# Patient Record
Sex: Female | Born: 1970 | Race: White | Hispanic: No | Smoking: Never smoker
Health system: Southern US, Community
[De-identification: ages and names within clinical notes are randomized; demographics above are authoritative.]

## PROBLEM LIST (undated history)

## (undated) HISTORY — PX: CATARACT EXTRACTION: SUR2

## (undated) HISTORY — PX: TUBAL LIGATION: SHX77

---

## 2005-11-29 ENCOUNTER — Other Ambulatory Visit: Payer: Self-pay

## 2005-11-29 ENCOUNTER — Emergency Department: Payer: Self-pay | Admitting: Emergency Medicine

## 2006-03-09 ENCOUNTER — Emergency Department: Payer: Self-pay | Admitting: Emergency Medicine

## 2006-11-07 ENCOUNTER — Encounter: Payer: Self-pay | Admitting: Maternal & Fetal Medicine

## 2006-11-18 ENCOUNTER — Encounter: Payer: Self-pay | Admitting: Maternal & Fetal Medicine

## 2006-12-19 ENCOUNTER — Emergency Department: Payer: Self-pay | Admitting: Emergency Medicine

## 2007-03-29 ENCOUNTER — Observation Stay: Payer: Self-pay

## 2007-04-03 ENCOUNTER — Observation Stay: Payer: Self-pay | Admitting: Obstetrics and Gynecology

## 2007-04-26 ENCOUNTER — Inpatient Hospital Stay: Payer: Self-pay | Admitting: Certified Nurse Midwife

## 2008-07-02 ENCOUNTER — Emergency Department: Payer: Self-pay | Admitting: Emergency Medicine

## 2008-12-31 ENCOUNTER — Ambulatory Visit: Payer: Self-pay | Admitting: Obstetrics and Gynecology

## 2010-12-04 ENCOUNTER — Emergency Department: Payer: Self-pay | Admitting: Emergency Medicine

## 2011-05-12 ENCOUNTER — Ambulatory Visit: Payer: Self-pay | Admitting: Neurology

## 2011-10-15 ENCOUNTER — Emergency Department: Payer: Self-pay | Admitting: *Deleted

## 2011-10-15 LAB — COMPREHENSIVE METABOLIC PANEL
Alkaline Phosphatase: 75 U/L (ref 50–136)
Calcium, Total: 9.2 mg/dL (ref 8.5–10.1)
Chloride: 104 mmol/L (ref 98–107)
Co2: 28 mmol/L (ref 21–32)
EGFR (African American): >60
EGFR (Non-African Amer.): >60
SGOT(AST): 33 U/L (ref 15–37)
SGPT (ALT): 30 U/L

## 2011-10-15 LAB — CBC
Platelet: 178 10*3/uL (ref 150–440)
RDW: 12.9 % (ref 11.5–14.5)
WBC: 7.1 10*3/uL (ref 3.6–11.0)

## 2011-10-15 LAB — URINALYSIS, COMPLETE
Bacteria: NONE SEEN
Bilirubin,UR: NEGATIVE
Blood: NEGATIVE
Glucose,UR: NEGATIVE mg/dL (ref 0–75)
Ketone: NEGATIVE
Nitrite: NEGATIVE
Specific Gravity: 1.016 (ref 1.003–1.030)
WBC UR: 1 /HPF (ref 0–5)

## 2011-10-15 LAB — PREGNANCY, URINE: Pregnancy Test, Urine: NEGATIVE m[IU]/mL

## 2011-10-18 ENCOUNTER — Emergency Department: Payer: Self-pay | Admitting: Emergency Medicine

## 2011-10-18 LAB — COMPREHENSIVE METABOLIC PANEL
Alkaline Phosphatase: 58 U/L (ref 50–136)
Bilirubin,Total: 0.3 mg/dL (ref 0.2–1.0)
Chloride: 99 mmol/L (ref 98–107)
Co2: 29 mmol/L (ref 21–32)
Creatinine: 1.14 mg/dL (ref 0.60–1.30)
EGFR (African American): >60
EGFR (Non-African Amer.): 57 — ABNORMAL LOW
Potassium: 3.5 mmol/L (ref 3.5–5.1)
SGOT(AST): 29 U/L (ref 15–37)
SGPT (ALT): 36 U/L

## 2011-10-19 LAB — URINALYSIS, COMPLETE
Nitrite: NEGATIVE
Protein: 30
RBC,UR: 3 /HPF (ref 0–5)
WBC UR: 8 /HPF (ref 0–5)

## 2012-05-13 ENCOUNTER — Emergency Department: Payer: Self-pay | Admitting: Emergency Medicine

## 2012-05-23 ENCOUNTER — Emergency Department: Payer: Self-pay | Admitting: Emergency Medicine

## 2012-05-23 ENCOUNTER — Emergency Department: Payer: Self-pay | Admitting: Unknown Physician Specialty

## 2012-05-23 LAB — BASIC METABOLIC PANEL
Anion Gap: 8 (ref 7–16)
BUN: 12 mg/dL (ref 7–18)
Co2: 28 mmol/L (ref 21–32)
Creatinine: 0.71 mg/dL (ref 0.60–1.30)
EGFR (African American): >60
EGFR (Non-African Amer.): >60
Glucose: 83 mg/dL (ref 65–99)
Sodium: 140 mmol/L (ref 136–145)

## 2012-05-23 LAB — CBC
MCH: 30.5 pg (ref 26.0–34.0)
Platelet: 221 10*3/uL (ref 150–440)
RDW: 13.7 % (ref 11.5–14.5)
WBC: 7.2 10*3/uL (ref 3.6–11.0)

## 2012-08-14 ENCOUNTER — Emergency Department: Payer: Self-pay | Admitting: Emergency Medicine

## 2013-06-03 ENCOUNTER — Ambulatory Visit: Payer: Self-pay

## 2015-05-09 ENCOUNTER — Other Ambulatory Visit: Payer: Self-pay | Admitting: Obstetrics and Gynecology

## 2015-05-09 DIAGNOSIS — Z1231 Encounter for screening mammogram for malignant neoplasm of breast: Secondary | ICD-10-CM

## 2015-05-13 ENCOUNTER — Ambulatory Visit
Admission: RE | Admit: 2015-05-13 | Discharge: 2015-05-13 | Disposition: A | Discharge status: Home / Self Care | Payer: BLUE CROSS/BLUE SHIELD | Source: Ambulatory Visit | Attending: Obstetrics and Gynecology | Admitting: Obstetrics and Gynecology

## 2015-05-13 DIAGNOSIS — Z1231 Encounter for screening mammogram for malignant neoplasm of breast: Secondary | ICD-10-CM | POA: Diagnosis not present

## 2015-10-18 ENCOUNTER — Encounter: Payer: Self-pay | Admitting: Emergency Medicine

## 2015-10-18 ENCOUNTER — Emergency Department
Admission: EM | Admit: 2015-10-18 | Discharge: 2015-10-18 | Disposition: A | Discharge status: Home / Self Care | Payer: BLUE CROSS/BLUE SHIELD | Attending: Emergency Medicine | Admitting: Emergency Medicine

## 2015-10-18 DIAGNOSIS — K2970 Gastritis, unspecified, without bleeding: Secondary | ICD-10-CM

## 2015-10-18 DIAGNOSIS — R079 Chest pain, unspecified: Secondary | ICD-10-CM | POA: Diagnosis present

## 2015-10-18 DIAGNOSIS — R1013 Epigastric pain: Secondary | ICD-10-CM

## 2015-10-18 LAB — COMPREHENSIVE METABOLIC PANEL
ALBUMIN: 4.3 g/dL (ref 3.5–5.0)
ALT: 13 U/L — AB (ref 14–54)
AST: 21 U/L (ref 15–41)
Alkaline Phosphatase: 69 U/L (ref 38–126)
Anion gap: 6 (ref 5–15)
BUN: 5 mg/dL — AB (ref 6–20)
CO2: 29 mmol/L (ref 22–32)
Calcium: 10.8 mg/dL — ABNORMAL HIGH (ref 8.9–10.3)
Chloride: 101 mmol/L (ref 101–111)
Creatinine, Ser: 0.84 mg/dL (ref 0.44–1.00)
GFR calc Af Amer: >60 mL/min (ref >60)
GFR calc non Af Amer: >60 mL/min (ref >60)
GLUCOSE: 91 mg/dL (ref 65–99)
POTASSIUM: 3.7 mmol/L (ref 3.5–5.1)
Sodium: 136 mmol/L (ref 135–145)
Total Bilirubin: 0.4 mg/dL (ref 0.3–1.2)
Total Protein: 7.3 g/dL (ref 6.5–8.1)

## 2015-10-18 LAB — CBC WITH DIFFERENTIAL/PLATELET
BASOS ABS: 0 10*3/uL (ref 0–0.1)
BASOS PCT: 1 %
Eosinophils Absolute: 0.1 10*3/uL (ref 0–0.7)
Eosinophils Relative: 1 %
HEMATOCRIT: 33.4 % — AB (ref 35.0–47.0)
Hemoglobin: 11.1 g/dL — ABNORMAL LOW (ref 12.0–16.0)
Lymphocytes Relative: 18 %
Lymphs Abs: 1 10*3/uL (ref 1.0–3.6)
MCH: 27.5 pg (ref 26.0–34.0)
MCHC: 33.2 g/dL (ref 32.0–36.0)
MCV: 83.1 fL (ref 80.0–100.0)
MONO ABS: 0.3 10*3/uL (ref 0.2–0.9)
Monocytes Relative: 5 %
NEUTROS ABS: 4.2 10*3/uL (ref 1.4–6.5)
Neutrophils Relative %: 75 %
PLATELETS: 253 10*3/uL (ref 150–440)
RBC: 4.02 MIL/uL (ref 3.80–5.20)
RDW: 17.2 % — AB (ref 11.5–14.5)
WBC: 5.5 10*3/uL (ref 3.6–11.0)

## 2015-10-18 LAB — URINALYSIS COMPLETE WITH MICROSCOPIC (ARMC ONLY)
Bilirubin Urine: NEGATIVE
Glucose, UA: NEGATIVE mg/dL
Ketones, ur: NEGATIVE mg/dL
Nitrite: NEGATIVE
PROTEIN: NEGATIVE mg/dL
SPECIFIC GRAVITY, URINE: 1.024 (ref 1.005–1.030)
pH: 5 (ref 5.0–8.0)

## 2015-10-18 LAB — TROPONIN I: Troponin I: <0.03 ng/mL (ref <0.031)

## 2015-10-18 LAB — LIPASE, BLOOD: LIPASE: 24 U/L (ref 11–51)

## 2015-10-18 MED ORDER — FAMOTIDINE 20 MG PO TABS
ORAL_TABLET | ORAL | Status: AC
  Filled 2015-10-18: qty 2

## 2015-10-18 MED ORDER — GI COCKTAIL ~~LOC~~
ORAL | Status: AC
  Administered 2015-10-18: 30 mL via ORAL
  Filled 2015-10-18: qty 30

## 2015-10-18 MED ORDER — FAMOTIDINE 20 MG PO TABS
40.0000 mg | ORAL_TABLET | Freq: Once | ORAL | Status: AC
  Administered 2015-10-18: 40 mg via ORAL

## 2015-10-18 MED ORDER — RANITIDINE HCL 150 MG PO CAPS: 150.0000 mg | ORAL_CAPSULE | Freq: Two times a day (BID) | ORAL | Status: DC

## 2015-10-18 MED ORDER — SUCRALFATE 1 G PO TABS: 1.0000 g | ORAL_TABLET | Freq: Four times a day (QID) | ORAL | Status: DC

## 2015-10-18 MED ORDER — GI COCKTAIL ~~LOC~~
30.0000 mL | ORAL | Status: AC
  Administered 2015-10-18: 30 mL via ORAL
  Filled 2015-10-18: qty 30

## 2015-10-18 NOTE — ED Provider Notes (Signed)
[facility] Emergency Department Provider Note  ____________________________________________  Time seen: 4:20 PM  I have reviewed the triage vital signs and the nursing notes.   HISTORY  Chief Complaint Chest Pain and Headache    HPI Denise Cooper is a 45 y.o. female who complains of nausea since yesterday. She reports some chest pain spent intermittent since then as well, but indicates her epigastrium when pointing to the area of the pain. It radiates upward. It's worse at night when she is lying flat. No vomiting. Not exertional, not pleuritic. No shortness of breath diaphoresis. Moderate intensity when present, no aggravating or alleviating factors. Aching feeling.     History reviewed. No pertinent past medical history.   There are no active problems to display for this patient.    History reviewed. No pertinent past surgical history.   Current Outpatient Rx  Name  Route  Sig  Dispense  Refill  . ranitidine (ZANTAC) 150 MG capsule   Oral   Take 1 capsule (150 mg total) by mouth 2 (two) times daily.   28 capsule   0   . sucralfate (CARAFATE) 1 g tablet   Oral   Take 1 tablet (1 g total) by mouth 4 (four) times daily.   120 tablet   1      Allergies Review of patient's allergies indicates no known allergies.   No family history on file.  Social History Social History  Substance Use Topics  . Smoking status: Never Smoker   . Smokeless tobacco: None  . Alcohol Use: None    Review of Systems  Constitutional:   No fever or chills. No weight changes Eyes:   No blurry vision or double vision.  ENT:   No sore throat.  Cardiovascular:   No chest pain. Respiratory:   No dyspnea or cough. Gastrointestinal:   Epigastric pain without vomiting or diarrhea.  No BRBPR or melena. Genitourinary:   Negative for dysuria or difficulty urinating. Musculoskeletal:   Negative for back pain. No joint swelling or pain. Skin:   Negative for  rash. Neurological:   Negative for headaches, focal weakness or numbness. Psychiatric:  No anxiety or depression.   Endocrine:  No changes in energy or sleep difficulty.  10-point ROS otherwise negative.  ____________________________________________   PHYSICAL EXAM:  VITAL SIGNS: ED Triage Vitals  Enc Vitals Group     BP 10/18/15 1251 131/79 mmHg     Pulse Rate 10/18/15 1251 60     Resp 10/18/15 1251 16     Temp 10/18/15 1251 98.2 F (36.8 C)     Temp Source 10/18/15 1251 Oral     SpO2 10/18/15 1251 100 %     Weight 10/18/15 1251 126 lb (57.153 kg)     Height 10/18/15 1251  (1.676 m)     Head Cir --      Peak Flow --      Pain Score 10/18/15 1301 5     Pain Loc --      Pain Edu? --      Excl. in GC? --     Vital signs reviewed, nursing assessments reviewed.   Constitutional:   Alert and oriented. Well appearing and in no distress. Eyes:   No scleral icterus. No conjunctival pallor. PERRL. EOMI ENT   Head:   Normocephalic and atraumatic.   Nose:   No congestion/rhinnorhea. No septal hematoma   Mouth/Throat:   MMM, no pharyngeal erythema. No peritonsillar mass.  Neck:   No stridor. No SubQ emphysema. No meningismus. Hematological/Lymphatic/Immunilogical:   No cervical lymphadenopathy. Cardiovascular:   RRR. Symmetric bilateral radial and DP pulses.  No murmurs.  Respiratory:   Normal respiratory effort without tachypnea nor retractions. Breath sounds are clear and equal bilaterally. No wheezes/rales/rhonchi. Gastrointestinal:   Soft with mild epigastric tenderness. Non distended. There is no CVA tenderness.  No rebound, rigidity, or guarding. Genitourinary:   deferred Musculoskeletal:   Nontender with normal range of motion in all extremities. No joint effusions.  No lower extremity tenderness.  No edema. Neurologic:   Normal speech and language.  CN 2-10 normal. Motor grossly intact. No gross focal neurologic deficits are appreciated.  Skin:     Skin is warm, dry and intact. No rash noted.  No petechiae, purpura, or bullae. Psychiatric:   Mood and affect are normal. ____________________________________________    LABS (pertinent positives/negatives) (all labs ordered are listed, but only abnormal results are displayed) Labs Reviewed  CBC WITH DIFFERENTIAL/PLATELET - Abnormal; Notable for the following:    Hemoglobin 11.1 (*)    HCT 33.4 (*)    RDW 17.2 (*)    All other components within normal limits  COMPREHENSIVE METABOLIC PANEL - Abnormal; Notable for the following:    BUN 5 (*)    Calcium 10.8 (*)    ALT 13 (*)    All other components within normal limits  URINALYSIS COMPLETEWITH MICROSCOPIC ([facility] ONLY) - Abnormal; Notable for the following:    Color, Urine YELLOW (*)    APPearance CLEAR (*)    Hgb urine dipstick 2+ (*)    Leukocytes, UA TRACE (*)    Bacteria, UA RARE (*)    Squamous Epithelial / LPF 0-5 (*)    All other components within normal limits  TROPONIN I  LIPASE, BLOOD   ____________________________________________   EKG  Interpreted by me Sinus bradycardia rate 57, normal axis intervals QRS ST segments and T waves  ____________________________________________    RADIOLOGY    ____________________________________________   PROCEDURES   ____________________________________________   INITIAL IMPRESSION / ASSESSMENT AND PLAN / ED COURSE  Pertinent labs & imaging results that were available during my care of the patient were reviewed by me and considered in my medical decision making (see chart for details).  Patient well appearing no acute distress. Presents with epigastric pain and nausea. Labs and vital signs unremarkable.Considering the patient's symptoms, medical history, and physical examination today, I have low suspicion for cholecystitis or biliary pathology, pancreatitis, perforation or bowel obstruction, hernia, intra-abdominal abscess, AAA or dissection, volvulus or  intussusception, mesenteric ischemia, or appendicitis.  I think most likely this is due to gastritis. We'll start the patient on acid suppression therapy with Zantac and Carafate and have her follow-up with her primary care doctor, [name].     ____________________________________________   FINAL CLINICAL IMPRESSION(S) / ED DIAGNOSES  Final diagnoses:  Gastritis  Epigastric pain      Sharman CheekPhillip Breyden Jeudy, MD 10/18/15 1806

## 2015-10-18 NOTE — ED Notes (Signed)
Reports nausea since yesterday, today having headache and chest pain.  Skin w/d with good color.

## 2015-10-18 NOTE — Discharge Instructions (Signed)
Abdominal Pain, Adult °Many things can cause abdominal pain. Usually, abdominal pain is not caused by a disease and will improve without treatment. It can often be observed and treated at home. Your health care provider will do a physical exam and possibly order blood tests and X-rays to help determine the seriousness of your pain. However, in many cases, more time must pass before a clear cause of the pain can be found. Before that point, your health care provider may not know if you need more testing or further treatment. °HOME CARE INSTRUCTIONS °Monitor your abdominal pain for any changes. The following actions may help to alleviate any discomfort you are experiencing: °· Only take over-the-counter or prescription medicines as directed by your health care provider. °· Do not take laxatives unless directed to do so by your health care provider. °· Try a clear liquid diet (broth, tea, or water) as directed by your health care provider. Slowly move to a bland diet as tolerated. °SEEK MEDICAL CARE IF: °· You have unexplained abdominal pain. °· You have abdominal pain associated with nausea or diarrhea. °· You have pain when you urinate or have a bowel movement. °· You experience abdominal pain that wakes you in the night. °· You have abdominal pain that is worsened or improved by eating food. °· You have abdominal pain that is worsened with eating fatty foods. °· You have a fever. °SEEK IMMEDIATE MEDICAL CARE IF: °· Your pain does not go away within 2 hours. °· You keep throwing up (vomiting). °· Your pain is felt only in portions of the abdomen, such as the right side or the left lower portion of the abdomen. °· You pass bloody or black tarry stools. °MAKE SURE YOU: °· Understand these instructions. °· Will watch your condition. °· Will get help right away if you are not doing well or get worse. °  °This information is not intended to replace advice given to you by your health care provider. Make sure you discuss  any questions you have with your health care provider. °  °Document Released: 05/02/2005 Document Revised: 04/13/2015 Document Reviewed: 04/01/2013 °Elsevier Interactive Patient Education ©2016 [employer] ° °Gastritis, Adult °Gastritis is soreness and swelling (inflammation) of the lining of the stomach. Gastritis can develop as a sudden onset (acute) or long-term (chronic) condition. If gastritis is not treated, it can lead to stomach bleeding and ulcers. °CAUSES  °Gastritis occurs when the stomach lining is weak or damaged. Digestive juices from the stomach then inflame the weakened stomach lining. The stomach lining may be weak or damaged due to viral or bacterial infections. One common bacterial infection is the Helicobacter pylori infection. Gastritis can also result from excessive alcohol consumption, taking certain medicines, or having too much acid in the stomach.  °SYMPTOMS  °In some cases, there are no symptoms. When symptoms are present, they may include: °· Pain or a burning sensation in the upper abdomen. °· Nausea. °· Vomiting. °· An uncomfortable feeling of fullness after eating. °DIAGNOSIS  °Your caregiver may suspect you have gastritis based on your symptoms and a physical exam. To determine the cause of your gastritis, your caregiver may perform the following: °· Blood or stool tests to check for the H pylori bacterium. °· Gastroscopy. A thin, flexible tube (endoscope) is passed down the esophagus and into the stomach. The endoscope has a light and camera on the end. Your caregiver uses the endoscope to view the inside of the stomach. °· Taking a tissue sample (  biopsy) from the stomach to examine under a microscope. °TREATMENT  °Depending on the cause of your gastritis, medicines may be prescribed. If you have a bacterial infection, such as an H pylori infection, antibiotics may be given. If your gastritis is caused by too much acid in the stomach, H2 blockers or antacids may be given. Your  caregiver may recommend that you stop taking aspirin, ibuprofen, or other nonsteroidal anti-inflammatory drugs (NSAIDs). °HOME CARE INSTRUCTIONS °· Only take over-the-counter or prescription medicines as directed by your caregiver. °· If you were given antibiotic medicines, take them as directed. Finish them even if you start to feel better. °· Drink enough fluids to keep your urine clear or pale yellow. °· Avoid foods and drinks that make your symptoms worse, such as: °¨ Caffeine or alcoholic drinks. °¨ Chocolate. °¨ Peppermint or mint flavorings. °¨ Garlic and onions. °¨ Spicy foods. °¨ Citrus fruits, such as oranges, lemons, or limes. °¨ Tomato-based foods such as sauce, chili, salsa, and pizza. °¨ Fried and fatty foods. °· Eat small, frequent meals instead of large meals. °SEEK IMMEDIATE MEDICAL CARE IF:  °· You have black or dark red stools. °· You vomit blood or material that looks like coffee grounds. °· You are unable to keep fluids down. °· Your abdominal pain gets worse. °· You have a fever. °· You do not feel better after 1 week. °· You have any other questions or concerns. °MAKE SURE YOU: °· Understand these instructions. °· Will watch your condition. °· Will get help right away if you are not doing well or get worse. °  °This information is not intended to replace advice given to you by your health care provider. Make sure you discuss any questions you have with your health care provider. °  °Document Released: 07/17/2001 Document Revised: 01/22/2012 Document Reviewed: 09/05/2011 °Elsevier Interactive Patient Education ©2016 [employer]vier Inc. ° °

## 2015-10-19 MED ORDER — LORAZEPAM 1 MG PO TABS
ORAL_TABLET | ORAL | Status: DC
Start: 2015-10-19 — End: 2015-10-19
  Filled 2015-10-19: qty 1

## 2016-09-12 ENCOUNTER — Emergency Department
Admission: EM | Admit: 2016-09-12 | Discharge: 2016-09-12 | Disposition: A | Discharge status: Home / Self Care | Payer: Medicaid Other | Attending: Student in an Organized Health Care Education/Training Program | Admitting: Student in an Organized Health Care Education/Training Program

## 2016-09-12 ENCOUNTER — Encounter: Payer: Self-pay | Admitting: *Deleted

## 2016-09-12 DIAGNOSIS — J111 Influenza due to unidentified influenza virus with other respiratory manifestations: Secondary | ICD-10-CM

## 2016-09-12 DIAGNOSIS — R0789 Other chest pain: Secondary | ICD-10-CM | POA: Diagnosis not present

## 2016-09-12 DIAGNOSIS — R0981 Nasal congestion: Secondary | ICD-10-CM | POA: Insufficient documentation

## 2016-09-12 DIAGNOSIS — R69 Illness, unspecified: Secondary | ICD-10-CM

## 2016-09-12 DIAGNOSIS — R509 Fever, unspecified: Secondary | ICD-10-CM | POA: Diagnosis present

## 2016-09-12 DIAGNOSIS — R05 Cough: Secondary | ICD-10-CM | POA: Diagnosis not present

## 2016-09-12 DIAGNOSIS — R51 Headache: Secondary | ICD-10-CM | POA: Diagnosis not present

## 2016-09-12 DIAGNOSIS — Z79899 Other long term (current) drug therapy: Secondary | ICD-10-CM | POA: Insufficient documentation

## 2016-09-12 DIAGNOSIS — R110 Nausea: Secondary | ICD-10-CM | POA: Insufficient documentation

## 2016-09-12 MED ORDER — OSELTAMIVIR PHOSPHATE 75 MG PO CAPS: 75.0000 mg | ORAL_CAPSULE | Freq: Two times a day (BID) | ORAL | 0 refills | Status: DC

## 2016-09-12 MED ORDER — PSEUDOEPH-BROMPHEN-DM 30-2-10 MG/5ML PO SYRP: 10.0000 mL | ORAL_SOLUTION | Freq: Four times a day (QID) | ORAL | 0 refills | Status: DC | PRN

## 2016-09-12 NOTE — ED Provider Notes (Signed)
[facility] Emergency Department Provider Note  ____________________________________________  Time seen: Approximately 2:12 PM  I have reviewed the triage vital signs and the nursing notes.   HISTORY  Chief Complaint Cough and Fever    HPI Denise Cooper is a 46 y.o. female , NAD, presents to the emergency department with 3 day history of flulike symptoms. Patient states she has had runny nose, nasal congestion, itchy eyes, cough and chest congestion over the last 3 days. Has also noted intermittent fevers with a MAXIMUM TEMPERATURE of 101F. Has been taking over-the-counter Tylenol and ibuprofen which seemed to alleviate her fevers and aches but states his symptoms return as the medications wear off. Denies any chest pain, shortness breath, wheezing, abdominal pain, nausea, vomiting or diarrhea.   History reviewed. No pertinent past medical history.  There are no active problems to display for this patient.   History reviewed. No pertinent surgical history.  Prior to Admission medications   Medication Sig Start Date End Date Taking? Authorizing Provider  brompheniramine-pseudoephedrine-DM 30-2-10 MG/5ML syrup Take 10 mLs by mouth 4 (four) times daily as needed. 09/12/16   Jami L Hagler, PA-C  oseltamivir (TAMIFLU) 75 MG capsule Take 1 capsule (75 mg total) by mouth 2 (two) times daily. 09/12/16   Jami L Hagler, PA-C  ranitidine (ZANTAC) 150 MG capsule Take 1 capsule (150 mg total) by mouth 2 (two) times daily. 10/18/15   Sharman CheekPhillip Stafford, MD  sucralfate (CARAFATE) 1 g tablet Take 1 tablet (1 g total) by mouth 4 (four) times daily. 10/18/15   Sharman CheekPhillip Stafford, MD    Allergies Patient has no known allergies.  History reviewed. No pertinent family history.  Social History Social History  Substance Use Topics  . Smoking status: Never Smoker  . Smokeless tobacco: Not on file  . Alcohol use Not on file     Review of Systems Constitutional: Positive  fever and chills. No fatigue or decreased appetite Eyes: Positive itchy eyes without redness, swelling or discharge. ENT: Positive nasal congestion, runny nose, nausea. No sore throat. Cardiovascular: No chest pain. Respiratory: Positive cough, chest congestion. No shortness of breath. No wheezing.  Gastrointestinal: No abdominal pain.  No nausea, vomiting.  No diarrhea.   Musculoskeletal: Positive general myalgias.  Skin: Negative for rash. Neurological: Positive for sinus headaches, but no focal weakness or numbness. 10-point ROS otherwise negative.  ____________________________________________   PHYSICAL EXAM:  VITAL SIGNS: ED Triage Vitals  Enc Vitals Group     BP 09/12/16 1237 116/82     Pulse Rate 09/12/16 1237 87     Resp 09/12/16 1237 18     Temp 09/12/16 1237 98.5 F (36.9 C)     Temp Source 09/12/16 1237 Oral     SpO2 09/12/16 1237 100 %     Weight 09/12/16 1237 135 lb (61.2 kg)     Height 09/12/16 1237 5\' 6"  (1.676 m)     Head Circumference --      Peak Flow --      Pain Score 09/12/16 1236 0     Pain Loc --      Pain Edu? --      Excl. in GC? --      Constitutional: Alert and oriented. Well appearing and in no acute distress. Eyes: Conjunctivae are normal without icterus, injection or discharge.  Head: Atraumatic. ENT:      Ears: TMs visualized bilaterally without erythema, effusion, bulging, perforation.      Nose: Mild congestion with  trace clear rhinorrhea. Bilateral turbinates are injected.      Mouth/Throat: Mucous membranes are moist. Pharynx without erythema, swelling. Clear postnasal drainage. Uvula is midline. Airway is patent. Neck: Supple with full range of motion. Hematological/Lymphatic/Immunilogical: No cervical lymphadenopathy. Cardiovascular: Normal rate, regular rhythm. Normal S1 and S2.  Good peripheral circulation. Respiratory: Normal respiratory effort without tachypnea or retractions. Lungs CTAB with breath sounds noted in all lung  fields. No wheeze, rhonchi, rales Neurologic:  Normal speech and language. No gross focal neurologic deficits are appreciated.  Skin:  Skin is warm, dry and intact. No rash noted. Psychiatric: Mood and affect are normal. Speech and behavior are normal. Patient exhibits appropriate insight and judgement.   ____________________________________________   LABS  None ____________________________________________  EKG  None ____________________________________________  RADIOLOGY  None ____________________________________________    PROCEDURES  Procedure(s) performed: None   Procedures   Medications - No data to display   ____________________________________________   INITIAL IMPRESSION / ASSESSMENT AND PLAN / ED COURSE  Pertinent labs & imaging results that were available during my care of the patient were reviewed by me and considered in my medical decision making (see chart for details).     Patient's diagnosis is consistent with influenza-like illness. Patient will be discharged home with prescriptions for Bromfed-DM and Tamiflu to take as directed.  May continue over-the-counter Tylenol or ibuprofen as needed. Patient is to follow up with [facility] if symptoms persist past this treatment course. Patient is given ED precautions to return to the ED for any worsening or new symptoms.   ____________________________________________  FINAL CLINICAL IMPRESSION(S) / ED DIAGNOSES  Final diagnoses:  Influenza-like illness      NEW MEDICATIONS STARTED DURING THIS VISIT:  Discharge Medication List as of 09/12/2016  2:21 PM    START taking these medications   Details  brompheniramine-pseudoephedrine-DM 30-2-10 MG/5ML syrup Take 10 mLs by mouth 4 (four) times daily as needed., Starting Wed 09/12/2016, Print    oseltamivir (TAMIFLU) 75 MG capsule Take 1 capsule (75 mg total) by mouth 2 (two) times daily., Starting Wed 09/12/2016, Print             Ernestene Kiel  [place], PA-C 09/12/16 1659    Willy Eddy, MD 09/21/16 1459

## 2016-09-12 NOTE — ED Triage Notes (Signed)
States flu like symptoms since sunday

## 2016-09-12 NOTE — ED Notes (Signed)
Pt reports cough, chills, fever, sore throat and dizziness since Sunday. Pt is alert and oriented at this time. NAD noted.

## 2017-03-12 ENCOUNTER — Other Ambulatory Visit: Payer: Self-pay | Admitting: Obstetrics and Gynecology

## 2017-03-12 DIAGNOSIS — Z1231 Encounter for screening mammogram for malignant neoplasm of breast: Secondary | ICD-10-CM

## 2017-04-12 ENCOUNTER — Ambulatory Visit
Admission: RE | Admit: 2017-04-12 | Discharge: 2017-04-12 | Disposition: A | Discharge status: Home / Self Care | Payer: BLUE CROSS/BLUE SHIELD | Source: Ambulatory Visit | Attending: Obstetrics and Gynecology | Admitting: Obstetrics and Gynecology

## 2017-04-12 DIAGNOSIS — Z1231 Encounter for screening mammogram for malignant neoplasm of breast: Secondary | ICD-10-CM | POA: Diagnosis present

## 2017-05-20 ENCOUNTER — Encounter: Payer: Self-pay | Admitting: Emergency Medicine

## 2017-05-20 ENCOUNTER — Emergency Department
Admission: EM | Admit: 2017-05-20 | Discharge: 2017-05-20 | Disposition: A | Discharge status: Home / Self Care | Payer: BLUE CROSS/BLUE SHIELD | Attending: Emergency Medicine | Admitting: Emergency Medicine

## 2017-05-20 DIAGNOSIS — M549 Dorsalgia, unspecified: Secondary | ICD-10-CM | POA: Diagnosis present

## 2017-05-20 DIAGNOSIS — S29019A Strain of muscle and tendon of unspecified wall of thorax, initial encounter: Secondary | ICD-10-CM

## 2017-05-20 DIAGNOSIS — S29012A Strain of muscle and tendon of back wall of thorax, initial encounter: Secondary | ICD-10-CM | POA: Diagnosis not present

## 2017-05-20 DIAGNOSIS — X58XXXA Exposure to other specified factors, initial encounter: Secondary | ICD-10-CM | POA: Diagnosis not present

## 2017-05-20 DIAGNOSIS — Y929 Unspecified place or not applicable: Secondary | ICD-10-CM | POA: Diagnosis not present

## 2017-05-20 DIAGNOSIS — Y939 Activity, unspecified: Secondary | ICD-10-CM | POA: Diagnosis not present

## 2017-05-20 DIAGNOSIS — Y999 Unspecified external cause status: Secondary | ICD-10-CM | POA: Diagnosis not present

## 2017-05-20 LAB — URINALYSIS, COMPLETE (UACMP) WITH MICROSCOPIC
BACTERIA UA: NONE SEEN
BILIRUBIN URINE: NEGATIVE
GLUCOSE, UA: NEGATIVE mg/dL
KETONES UR: NEGATIVE mg/dL
LEUKOCYTES UA: NEGATIVE
NITRITE: NEGATIVE
PROTEIN: NEGATIVE mg/dL
Specific Gravity, Urine: 1.015 (ref 1.005–1.030)
pH: 5 (ref 5.0–8.0)

## 2017-05-20 LAB — POCT PREGNANCY, URINE: PREG TEST UR: NEGATIVE

## 2017-05-20 MED ORDER — IBUPROFEN 600 MG PO TABS: 600.0000 mg | ORAL_TABLET | Freq: Three times a day (TID) | ORAL | 0 refills | Status: DC | PRN

## 2017-05-20 MED ORDER — METHOCARBAMOL 500 MG PO TABS: 500.0000 mg | ORAL_TABLET | Freq: Four times a day (QID) | ORAL | 0 refills | Status: DC | PRN

## 2017-05-20 NOTE — ED Notes (Signed)
See triage note.  Pt states she normally wears tennis shoes to work, but decided to wear boots today.  Pt states she has a history of some back pain when she changes shoes at work, but that normally ibuprofen relieves it.  Pt denies injury.  Pt states that she has been having back spasms on and off today as well.

## 2017-05-20 NOTE — ED Triage Notes (Signed)
States while at work today, back started to hurt.  Patient changed shoes and pain worsened.  Pain is mid to upper back between shoulders.  1000 mg Ibuprofen taken at 0945.  Patient describes pain as intermittent.  Relief with rest.  Pain with movement.  Denies injury.

## 2017-05-20 NOTE — ED Provider Notes (Signed)
[facility] Emergency Department Provider Note  ___________________________________________   First MD Initiated Contact with Patient 05/20/17 1213     (approximate)  I have reviewed the triage vital signs and the nursing notes.   HISTORY  Chief Complaint Back Pain   HPI Denise Cooper is a 46 y.o. female is here with complaint of back pain. Patient states that this occurred while she was at work. She denies any history of injury.patient took what describes this as a grabbing sensation between her shoulder blades. Patient states that she changed her shoes and attempt to make her back. According which did not help. Patient took ibuprofen at approximately 9:45 AM. She describes the pain as intermittent that relieves completely with rest.currently she rates her pain as a 9/10.  History reviewed. No pertinent past medical history.  There are no active problems to display for this patient.   History reviewed. No pertinent surgical history.  Prior to Admission medications   Medication Sig Start Date End Date Taking? Authorizing Provider  ibuprofen (ADVIL,MOTRIN) 600 MG tablet Take 1 tablet (600 mg total) by mouth every 8 (eight) hours as needed. 05/20/17   Tommi Rumps, PA-C  methocarbamol (ROBAXIN) 500 MG tablet Take 1 tablet (500 mg total) by mouth every 6 (six) hours as needed for muscle spasms. 05/20/17   Tommi Rumps, PA-C    Allergies Patient has no known allergies.  No family history on file.  Social History Social History  Substance Use Topics  . Smoking status: Never Smoker  . Smokeless tobacco: Never Used  . Alcohol use Not on file    Review of Systems Constitutional: No fever/chills Cardiovascular: Denies chest pain. Respiratory: Denies shortness of breath. Gastrointestinal: No abdominal pain.  No nausea, no vomiting.  Genitourinary: Negative for dysuria. Musculoskeletal: positive for upper back pain. Skin: Negative for  rash. Neurological: Negative for headaches, focal weakness or numbness. ___________________________________________   PHYSICAL EXAM:  VITAL SIGNS: ED Triage Vitals [05/20/17 1109]  Enc Vitals Group     BP 115/76     Pulse Rate 64     Resp 18     Temp 98.7 F (37.1 C)     Temp Source Oral     SpO2 100 %     Weight 143 lb (64.9 kg)     Height  (1.676 m)     Head Circumference      Peak Flow      Pain Score 9     Pain Loc      Pain Edu?      Excl. in GC?    Constitutional: Alert and oriented. Well appearing and in no acute distress. Eyes: Conjunctivae are normal.  Head: Atraumatic. Neck: No stridor.   Cardiovascular: Normal rate, regular rhythm. Grossly normal heart sounds.  Good peripheral circulation. Respiratory: Normal respiratory effort.  No retractions. Lungs CTAB. Gastrointestinal: Soft and nontender. No distention.  Musculoskeletal: there is soft tissue tenderness on palpation of the paravertebral muscles bilaterally thoracic spine. No tenderness is noted on point palpation of the scapulas bilaterally. Range of motion is unrestricted. No soft tissue swelling or discoloration is noted. No point tenderness on palpation of the thoracic spine. Neurologic:  Normal speech and language. No gross focal neurologic deficits are appreciated. No gait instability. Skin:  Skin is warm, dry and intact. No erythema, ecchymosis or abrasions noted. Psychiatric: Mood and affect are normal. Speech and behavior are normal.  ____________________________________________   LABS (all labs ordered  are listed, but only abnormal results are displayed)  Labs Reviewed  URINALYSIS, COMPLETE (UACMP) WITH MICROSCOPIC - Abnormal; Notable for the following:       Result Value   Color, Urine YELLOW (*)    APPearance CLEAR (*)    Hgb urine dipstick SMALL (*)    Squamous Epithelial / LPF 0-5 (*)    All other components within normal limits  POC URINE PREG, ED  POCT PREGNANCY, URINE       PROCEDURES  Procedure(s) performed: None  Procedures  Critical Care performed: No  ____________________________________________   INITIAL IMPRESSION / ASSESSMENT AND PLAN / ED COURSE Patient is given prescription for ibuprofen 600 mg 3 times a day with food and methocarbamol 500 mg 4 times a day as needed for muscle spasms. She is encouraged to use ice or heat to her back. She is aware that she cannot take the muscle relaxant and drive or operate machinery.she'll follow-up with [facility]  clinic if any continued problems.   ___________________________________________   FINAL CLINICAL IMPRESSION(S) / ED DIAGNOSES  Final diagnoses:  Thoracic myofascial strain, initial encounter      NEW MEDICATIONS STARTED DURING THIS VISIT:  Discharge Medication List as of 05/20/2017  1:27 PM    START taking these medications   Details  ibuprofen (ADVIL,MOTRIN) 600 MG tablet Take 1 tablet (600 mg total) by mouth every 8 (eight) hours as needed., Starting Mon 05/20/2017, Print    methocarbamol (ROBAXIN) 500 MG tablet Take 1 tablet (500 mg total) by mouth every 6 (six) hours as needed for muscle spasms., Starting Mon 05/20/2017, Print         Note:  This document was prepared using Dragon voice recognition software and may include unintentional dictation errors.    Tommi Rumps, PA-C 05/20/17 1755    Dionne Bucy, MD 05/22/17 [phone]

## 2017-05-20 NOTE — Discharge Instructions (Signed)
Follow-up with [facility] if any continued problems. Use ice or heat to your back as needed for discomfort. Continue with ibuprofen 600 mg 3 times a day with food. Robaxin 500 mg 4 times a day for muscle spasms. Did not take the muscle relaxant while driving or operating machinery.

## 2017-05-20 NOTE — ED Notes (Signed)
Pt discharged to home.  Family member driving.  Discharge instructions reviewed.  Verbalized understanding.  No questions or concerns at this time.  Teach back verified.  Pt in NAD.  No items left in ED.   

## 2017-12-16 ENCOUNTER — Encounter: Payer: Self-pay | Admitting: Emergency Medicine

## 2017-12-16 ENCOUNTER — Emergency Department
Admission: EM | Admit: 2017-12-16 | Discharge: 2017-12-16 | Disposition: A | Discharge status: Home / Self Care | Payer: BLUE CROSS/BLUE SHIELD | Attending: Emergency Medicine | Admitting: Emergency Medicine

## 2017-12-16 ENCOUNTER — Other Ambulatory Visit: Payer: Self-pay

## 2017-12-16 DIAGNOSIS — M545 Low back pain, unspecified: Secondary | ICD-10-CM

## 2017-12-16 LAB — URINALYSIS, COMPLETE (UACMP) WITH MICROSCOPIC
Bilirubin Urine: NEGATIVE
Glucose, UA: NEGATIVE mg/dL
Hgb urine dipstick: NEGATIVE
Ketones, ur: NEGATIVE mg/dL
Nitrite: NEGATIVE
Protein, ur: NEGATIVE mg/dL
Specific Gravity, Urine: 1.004 — ABNORMAL LOW (ref 1.005–1.030)
pH: 6 (ref 5.0–8.0)

## 2017-12-16 MED ORDER — KETOROLAC TROMETHAMINE 30 MG/ML IJ SOLN
30.0000 mg | Freq: Once | INTRAMUSCULAR | Status: AC
  Administered 2017-12-16: 30 mg via INTRAMUSCULAR
  Filled 2017-12-16: qty 1

## 2017-12-16 MED ORDER — ORPHENADRINE CITRATE 30 MG/ML IJ SOLN
60.0000 mg | Freq: Two times a day (BID) | INTRAMUSCULAR | Status: DC
  Administered 2017-12-16: 60 mg via INTRAMUSCULAR
  Filled 2017-12-16: qty 2

## 2017-12-16 MED ORDER — ORPHENADRINE CITRATE ER 100 MG PO TB12: 100.0000 mg | ORAL_TABLET | Freq: Two times a day (BID) | ORAL | 1 refills | Status: AC | PRN

## 2017-12-16 MED ORDER — KETOROLAC TROMETHAMINE 10 MG PO TABS: 10.0000 mg | ORAL_TABLET | Freq: Four times a day (QID) | ORAL | 0 refills | Status: AC | PRN

## 2017-12-16 NOTE — ED Notes (Signed)
Pt states she has degenerative disc disease in her back and it has flared. Pt said she just drove to [place] and back. Pain has been worse since. Pt went to her chiropractor and was adjusted, pain was fine for a very short time then returned. Pt said she used biofreeze today with relief for only about an hour.

## 2017-12-16 NOTE — ED Provider Notes (Signed)
[facility] Emergency Department Provider Note  ____________________________________________  Time seen: Approximately 9:26 PM  I have reviewed the triage vital signs and the nursing notes.   HISTORY  Chief Complaint Back Pain    HPI Denise Cooper is a 47 y.o. female presents to the emergency department with bilateral low back pain without radiculopathy that worsened after patient drove to [place] and back.  Patient reports that she had a urinary tract infection 1 month ago.  She denies dysuria, increased urinary frequency, nausea or fever.  Patient denies any falls or mechanisms of trauma.  She denies dyspareunia or changes in vaginal discharge.  Patient has taken ibuprofen and a muscle relaxer, which  only temporarily relieves her symptoms.  History reviewed. No pertinent past medical history.  There are no active problems to display for this patient.   Past Surgical History:  Procedure Laterality Date  . CATARACT EXTRACTION    . TUBAL LIGATION      Prior to Admission medications   Medication Sig Start Date End Date Taking? Authorizing Provider  ibuprofen (ADVIL,MOTRIN) 600 MG tablet Take 1 tablet (600 mg total) by mouth every 8 (eight) hours as needed. 05/20/17   Tommi Rumps, PA-C  ketorolac (TORADOL) 10 MG tablet Take 1 tablet (10 mg total) by mouth every 6 (six) hours as needed for up to 5 days. 12/16/17 12/21/17  Orvil Feil, PA-C  methocarbamol (ROBAXIN) 500 MG tablet Take 1 tablet (500 mg total) by mouth every 6 (six) hours as needed for muscle spasms. 05/20/17   Tommi Rumps, PA-C  orphenadrine (NORFLEX) 100 MG tablet Take 1 tablet (100 mg total) by mouth 2 (two) times daily as needed for up to 5 days for muscle spasms. 12/16/17 12/21/17  Orvil Feil, PA-C    Allergies Patient has no known allergies.  No family history on file.  Social History Social History   Tobacco Use  . Smoking status: Never Smoker  . Smokeless  tobacco: Never Used  Substance Use Topics  . Alcohol use: Not on file  . Drug use: Not on file     Review of Systems  Constitutional: No fever/chills Eyes: No visual changes. No discharge ENT: No upper respiratory complaints. Cardiovascular: no chest pain. Respiratory: no cough. No SOB. Gastrointestinal: No abdominal pain.  No nausea, no vomiting.  No diarrhea.  No constipation. Genitourinary: Negative for dysuria. No hematuria Musculoskeletal: Patient has low back pain.  Skin: Negative for rash, abrasions, lacerations, ecchymosis. Neurological: Negative for headaches, focal weakness or numbness.   ____________________________________________   PHYSICAL EXAM:  VITAL SIGNS: ED Triage Vitals  Enc Vitals Group     BP 12/16/17 1944 109/69     Pulse Rate 12/16/17 1944 67     Resp 12/16/17 1944 18     Temp 12/16/17 1944 98.9 F (37.2 C)     Temp Source 12/16/17 1944 Oral     SpO2 12/16/17 1944 96 %     Weight 12/16/17 1944 140 lb (63.5 kg)     Height 12/16/17 1944  (1.676 m)     Head Circumference --      Peak Flow --      Pain Score 12/16/17 1955 8     Pain Loc --      Pain Edu? --      Excl. in GC? --      Constitutional: Alert and oriented. Well appearing and in no acute distress. Eyes: Conjunctivae are normal. PERRL.  EOMI. Head: Atraumatic. Cardiovascular: Normal rate, regular rhythm. Normal S1 and S2.  Good peripheral circulation. Respiratory: Normal respiratory effort without tachypnea or retractions. Lungs CTAB. Good air entry to the bases with no decreased or absent breath sounds. Gastrointestinal: Bowel sounds 4 quadrants. Soft and nontender to palpation. No guarding or rigidity. No palpable masses. No distention. No CVA tenderness. Musculoskeletal: Full range of motion to all extremities. No gross deformities appreciated.  Patient has paraspinal muscle tenderness along the lumbar spine. Neurologic:  Normal speech and language. No gross focal neurologic  deficits are appreciated.  Skin:  Skin is warm, dry and intact. No rash noted. Psychiatric: Mood and affect are normal. Speech and behavior are normal. Patient exhibits appropriate insight and judgement.   ____________________________________________   LABS (all labs ordered are listed, but only abnormal results are displayed)  Labs Reviewed  URINALYSIS, COMPLETE (UACMP) WITH MICROSCOPIC - Abnormal; Notable for the following components:      Result Value   Color, Urine STRAW (*)    APPearance CLEAR (*)    Specific Gravity, Urine 1.004 (*)    Leukocytes, UA TRACE (*)    Bacteria, UA FEW (*)    All other components within normal limits   ____________________________________________  EKG   ____________________________________________  RADIOLOGY   No results found.  ____________________________________________    PROCEDURES  Procedure(s) performed:    Procedures    Medications  orphenadrine (NORFLEX) injection 60 mg (60 mg Intramuscular Given 12/16/17 2201)  ketorolac (TORADOL) 30 MG/ML injection 30 mg (30 mg Intramuscular Given 12/16/17 2205)     ____________________________________________   INITIAL IMPRESSION / ASSESSMENT AND PLAN / ED COURSE  Pertinent labs & imaging results that were available during my care of the patient were reviewed by me and considered in my medical decision making (see chart for details).  Review of the [place] CSRS was performed in accordance of the [facility] prior to dispensing any controlled drugs.     Assessment and Plan:  Patient presents to the emergency department with low back pain.  Differential diagnosis included cystitis versus muscle spasm.  Patient reports that her low back pain almost completely resolved with Toradol and Norflex administered in the emergency department.  She was discharged with Toradol and Norflex.  Vital signs are reassuring prior to discharge.  All patient questions were  answered. ____________________________________________  FINAL CLINICAL IMPRESSION(S) / ED DIAGNOSES  Final diagnoses:  Acute bilateral low back pain without sciatica      NEW MEDICATIONS STARTED DURING THIS VISIT:  ED Discharge Orders        Ordered    ketorolac (TORADOL) 10 MG tablet  Every 6 hours PRN     12/16/17 2310    orphenadrine (NORFLEX) 100 MG tablet  2 times daily PRN     12/16/17 2310          This chart was dictated using voice recognition software/Dragon. Despite best efforts to proofread, errors can occur which can change the meaning. Any change was purely unintentional.    Orvil Feil, PA-C 12/16/17 2341    Sharman Cheek, MD 12/18/17 [phone]
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
## 2018-04-08 ENCOUNTER — Other Ambulatory Visit: Payer: Self-pay | Admitting: Obstetrics and Gynecology

## 2018-04-08 DIAGNOSIS — Z1231 Encounter for screening mammogram for malignant neoplasm of breast: Secondary | ICD-10-CM

## 2018-05-02 ENCOUNTER — Ambulatory Visit
Admission: RE | Admit: 2018-05-02 | Discharge: 2018-05-02 | Disposition: A | Discharge status: Home / Self Care | Payer: BLUE CROSS/BLUE SHIELD | Source: Ambulatory Visit | Attending: Obstetrics and Gynecology | Admitting: Obstetrics and Gynecology

## 2018-05-02 DIAGNOSIS — Z1231 Encounter for screening mammogram for malignant neoplasm of breast: Secondary | ICD-10-CM | POA: Diagnosis not present

## 2018-07-16 ENCOUNTER — Other Ambulatory Visit: Payer: Self-pay | Admitting: Orthopaedic Surgery

## 2018-07-16 DIAGNOSIS — M25532 Pain in left wrist: Secondary | ICD-10-CM

## 2018-07-23 ENCOUNTER — Ambulatory Visit
Admission: RE | Admit: 2018-07-23 | Discharge: 2018-07-23 | Disposition: A | Discharge status: Home / Self Care | Payer: BLUE CROSS/BLUE SHIELD | Source: Ambulatory Visit | Attending: Orthopaedic Surgery | Admitting: Orthopaedic Surgery

## 2018-07-23 DIAGNOSIS — M25532 Pain in left wrist: Secondary | ICD-10-CM

## 2018-07-23 MED ORDER — GADOBENATE DIMEGLUMINE 529 MG/ML IV SOLN
12.0000 mL | Freq: Once | INTRAVENOUS | Status: AC | PRN
  Administered 2018-07-23: 12 mL via INTRAVENOUS

## 2019-06-24 ENCOUNTER — Emergency Department
Admission: EM | Admit: 2019-06-24 | Discharge: 2019-06-25 | Disposition: A | Discharge status: Home / Self Care | Payer: BC Managed Care – PPO | Attending: Emergency Medicine | Admitting: Emergency Medicine

## 2019-06-24 ENCOUNTER — Encounter: Payer: Self-pay | Admitting: Emergency Medicine

## 2019-06-24 ENCOUNTER — Other Ambulatory Visit: Payer: Self-pay

## 2019-06-24 ENCOUNTER — Emergency Department: Payer: BC Managed Care – PPO

## 2019-06-24 DIAGNOSIS — R0789 Other chest pain: Secondary | ICD-10-CM | POA: Insufficient documentation

## 2019-06-24 DIAGNOSIS — R079 Chest pain, unspecified: Secondary | ICD-10-CM | POA: Diagnosis present

## 2019-06-24 LAB — CBC
HCT: 34.9 % — ABNORMAL LOW (ref 36.0–46.0)
Hemoglobin: 11.3 g/dL — ABNORMAL LOW (ref 12.0–15.0)
MCH: 28.9 pg (ref 26.0–34.0)
MCHC: 32.4 g/dL (ref 30.0–36.0)
MCV: 89.3 fL (ref 80.0–100.0)
Platelets: 260 10*3/uL (ref 150–400)
RBC: 3.91 MIL/uL (ref 3.87–5.11)
RDW: 13.4 % (ref 11.5–15.5)
WBC: 8.1 10*3/uL (ref 4.0–10.5)
nRBC: 0 % (ref 0.0–0.2)

## 2019-06-24 LAB — BASIC METABOLIC PANEL
Anion gap: 8 (ref 5–15)
BUN: 12 mg/dL (ref 6–20)
CO2: 25 mmol/L (ref 22–32)
Calcium: 9 mg/dL (ref 8.9–10.3)
Chloride: 104 mmol/L (ref 98–111)
Creatinine, Ser: 0.88 mg/dL (ref 0.44–1.00)
GFR calc Af Amer: >60 mL/min (ref >60)
GFR calc non Af Amer: >60 mL/min (ref >60)
Glucose, Bld: 89 mg/dL (ref 70–99)
Potassium: 4.1 mmol/L (ref 3.5–5.1)
Sodium: 137 mmol/L (ref 135–145)

## 2019-06-24 LAB — TROPONIN I (HIGH SENSITIVITY)
Troponin I (High Sensitivity): <2 ng/L (ref <18)
Troponin I (High Sensitivity): 3 ng/L (ref <18)

## 2019-06-24 NOTE — Discharge Instructions (Signed)
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
## 2019-06-24 NOTE — ED Triage Notes (Signed)
Pt from home via EMS c/o mid chest pain that started today. Denies SOB , VS

## 2019-06-24 NOTE — ED Triage Notes (Signed)
Pt to ED via ems from home- C/O Substernal CP that began around 1535- no change with palpation pt rates pain 7/10.  Reports Nausea and dizziness. 116/70 50's 20G L AC 4Zofran, 324 ASA 1SL NTG.

## 2019-11-03 ENCOUNTER — Other Ambulatory Visit: Payer: Self-pay

## 2019-11-03 ENCOUNTER — Encounter: Payer: Self-pay | Admitting: Emergency Medicine

## 2019-11-03 ENCOUNTER — Emergency Department
Admission: EM | Admit: 2019-11-03 | Discharge: 2019-11-03 | Disposition: A | Discharge status: Home / Self Care | Payer: BLUE CROSS/BLUE SHIELD | Attending: Student in an Organized Health Care Education/Training Program | Admitting: Student in an Organized Health Care Education/Training Program

## 2019-11-03 ENCOUNTER — Emergency Department: Payer: BLUE CROSS/BLUE SHIELD

## 2019-11-03 DIAGNOSIS — S0081XA Abrasion of other part of head, initial encounter: Secondary | ICD-10-CM

## 2019-11-03 DIAGNOSIS — Y929 Unspecified place or not applicable: Secondary | ICD-10-CM | POA: Diagnosis not present

## 2019-11-03 DIAGNOSIS — Z79899 Other long term (current) drug therapy: Secondary | ICD-10-CM | POA: Insufficient documentation

## 2019-11-03 DIAGNOSIS — S0993XA Unspecified injury of face, initial encounter: Secondary | ICD-10-CM | POA: Diagnosis present

## 2019-11-03 DIAGNOSIS — R519 Headache, unspecified: Secondary | ICD-10-CM | POA: Diagnosis not present

## 2019-11-03 DIAGNOSIS — Y9389 Activity, other specified: Secondary | ICD-10-CM | POA: Diagnosis not present

## 2019-11-03 DIAGNOSIS — S0031XA Abrasion of nose, initial encounter: Secondary | ICD-10-CM | POA: Insufficient documentation

## 2019-11-03 DIAGNOSIS — S0083XA Contusion of other part of head, initial encounter: Secondary | ICD-10-CM | POA: Insufficient documentation

## 2019-11-03 DIAGNOSIS — S060X0A Concussion without loss of consciousness, initial encounter: Secondary | ICD-10-CM | POA: Diagnosis not present

## 2019-11-03 DIAGNOSIS — Y999 Unspecified external cause status: Secondary | ICD-10-CM | POA: Diagnosis not present

## 2019-11-03 MED ORDER — HYDROCODONE-ACETAMINOPHEN 5-325 MG PO TABS: 1.0000 | ORAL_TABLET | Freq: Four times a day (QID) | ORAL | 0 refills | Status: AC | PRN

## 2019-11-03 NOTE — Discharge Instructions (Addendum)
Follow-up with your primary care provider or [facility] if any continued problems.  Return to the emergency department for any severe worsening of your symptoms.  A prescription for Norco was sent to your pharmacy to take every 6 hours if needed for headache.  Ice to your face to reduce swelling and help with pain.  Do not take the pain medication drive or operate machinery.
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
## 2020-07-07 ENCOUNTER — Other Ambulatory Visit: Payer: Self-pay | Admitting: Obstetrics and Gynecology

## 2020-07-07 DIAGNOSIS — Z1231 Encounter for screening mammogram for malignant neoplasm of breast: Secondary | ICD-10-CM

## 2021-02-08 ENCOUNTER — Other Ambulatory Visit: Payer: Self-pay | Admitting: Internal Medicine

## 2021-02-08 DIAGNOSIS — Z1231 Encounter for screening mammogram for malignant neoplasm of breast: Secondary | ICD-10-CM

## 2021-09-07 ENCOUNTER — Other Ambulatory Visit: Payer: Self-pay

## 2021-09-07 ENCOUNTER — Ambulatory Visit
Admission: RE | Admit: 2021-09-07 | Discharge: 2021-09-07 | Disposition: A | Discharge status: Home / Self Care | Payer: BLUE CROSS/BLUE SHIELD | Source: Ambulatory Visit | Attending: Internal Medicine | Admitting: Internal Medicine

## 2021-09-07 DIAGNOSIS — Z1231 Encounter for screening mammogram for malignant neoplasm of breast: Secondary | ICD-10-CM | POA: Diagnosis present

## 2022-03-30 ENCOUNTER — Other Ambulatory Visit: Payer: Self-pay | Admitting: Internal Medicine

## 2022-03-30 DIAGNOSIS — G8929 Other chronic pain: Secondary | ICD-10-CM

## 2022-04-12 ENCOUNTER — Ambulatory Visit
Admission: RE | Admit: 2022-04-12 | Discharge: 2022-04-12 | Disposition: A | Discharge status: Home / Self Care | Payer: BLUE CROSS/BLUE SHIELD | Source: Ambulatory Visit | Attending: Internal Medicine | Admitting: Internal Medicine

## 2022-04-12 DIAGNOSIS — M25562 Pain in left knee: Secondary | ICD-10-CM | POA: Diagnosis present

## 2022-04-12 DIAGNOSIS — G8929 Other chronic pain: Secondary | ICD-10-CM | POA: Insufficient documentation

## 2023-03-04 ENCOUNTER — Other Ambulatory Visit: Payer: Self-pay | Admitting: Internal Medicine

## 2023-03-04 DIAGNOSIS — Z1231 Encounter for screening mammogram for malignant neoplasm of breast: Secondary | ICD-10-CM

## 2023-08-14 ENCOUNTER — Other Ambulatory Visit: Payer: Self-pay | Admitting: Obstetrics and Gynecology

## 2023-08-14 DIAGNOSIS — Z1231 Encounter for screening mammogram for malignant neoplasm of breast: Secondary | ICD-10-CM

## 2023-10-20 IMAGING — MG MM DIGITAL SCREENING BILAT W/ TOMO AND CAD
8 series · 8 of 24 positions shown · non-contrast
Comparison: Previous exam(s).

CLINICAL DATA: Screening.

EXAM:
DIGITAL SCREENING BILATERAL MAMMOGRAM WITH TOMOSYNTHESIS AND CAD
TECHNIQUE: Bilateral screening digital craniocaudal and mediolateral oblique
mammograms were obtained. Bilateral screening digital breast
tomosynthesis was performed. The images were evaluated with
computer-aided detection.

[R CC synth-2D]
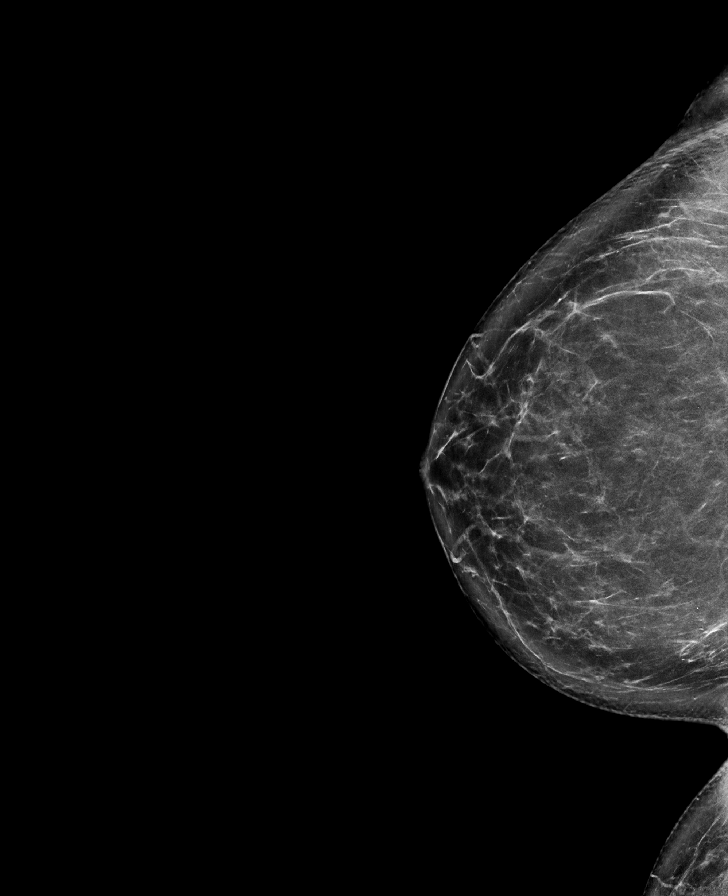

[L MLO synth-2D]
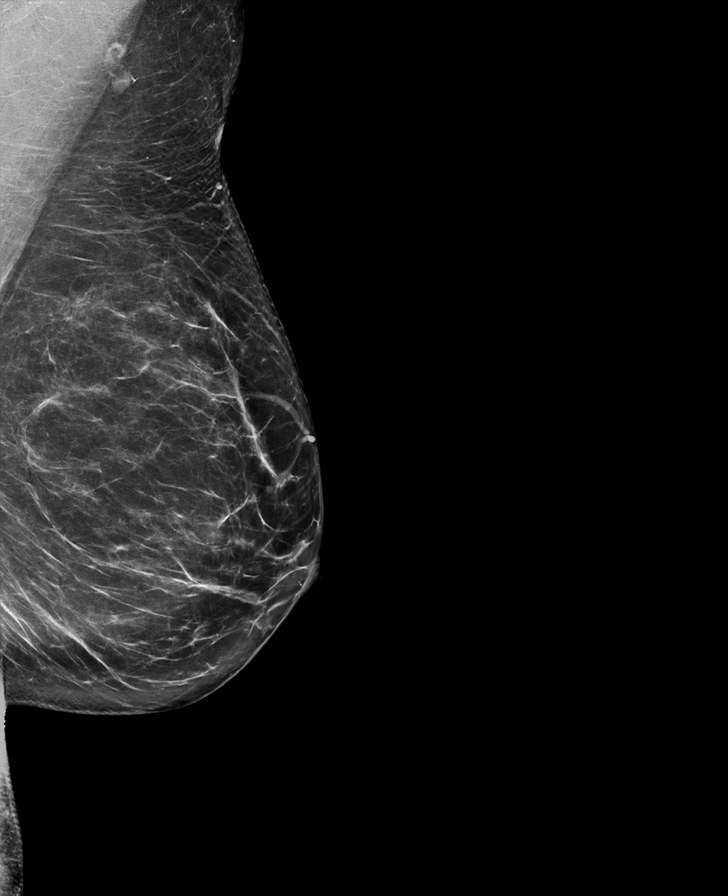

[R MLO synth-2D]
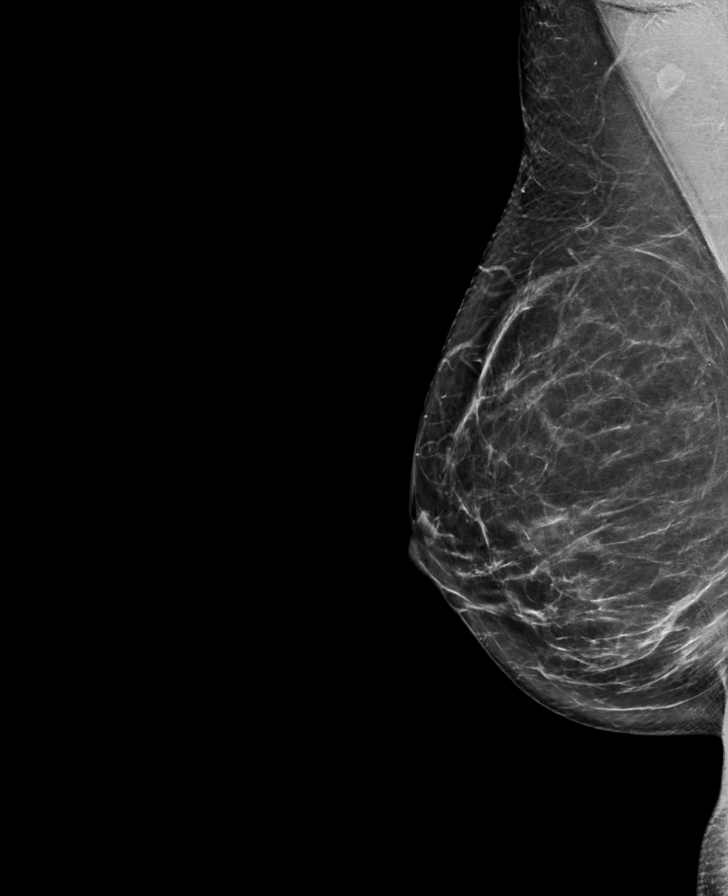

[L CC synth-2D]
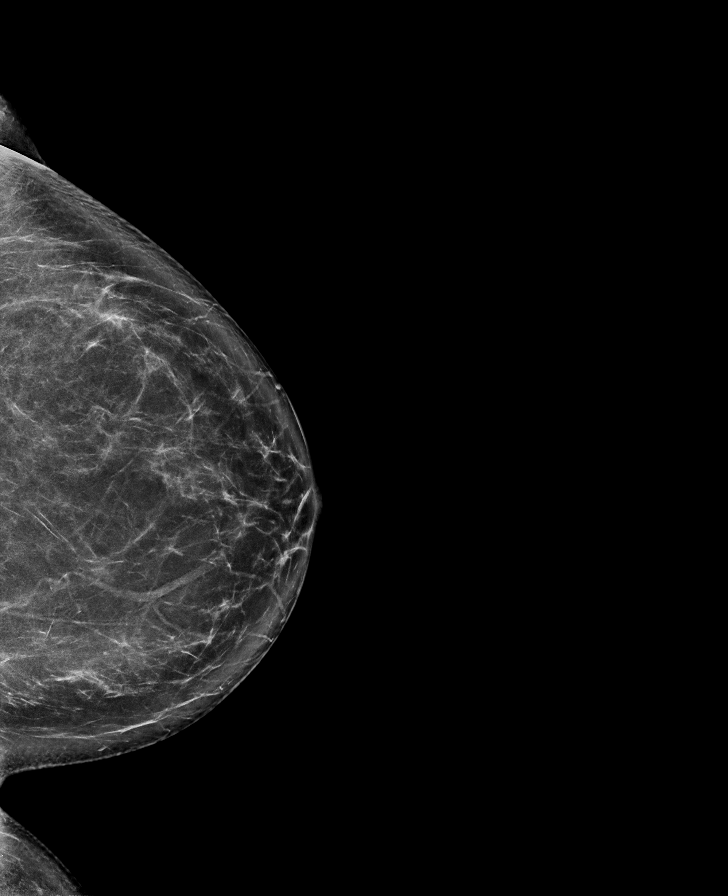

[L MLO tomo · tomo slice 44/87.0]
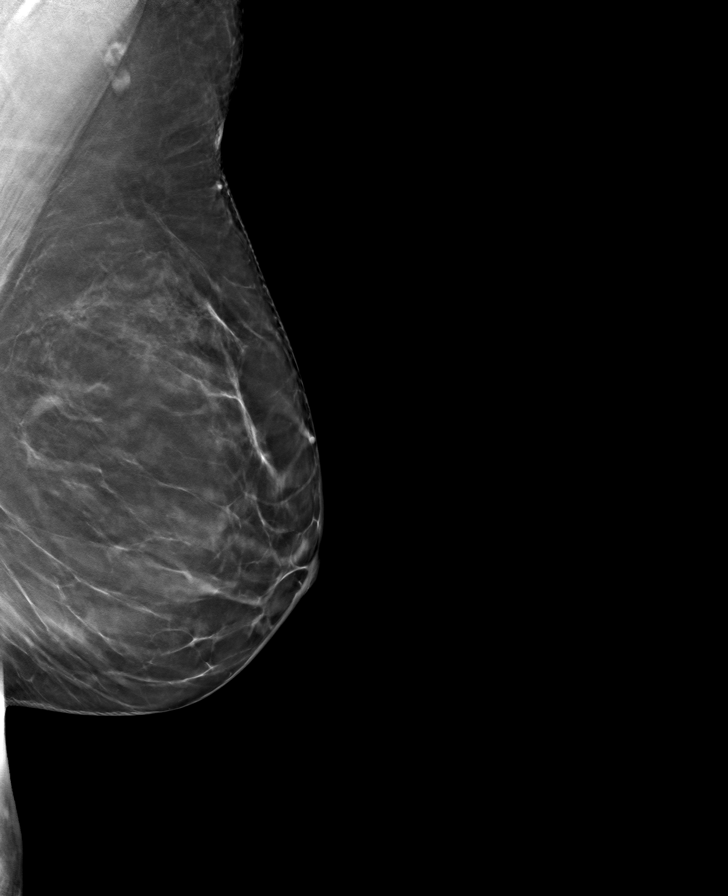

[R MLO tomo · tomo slice 43/85.0]
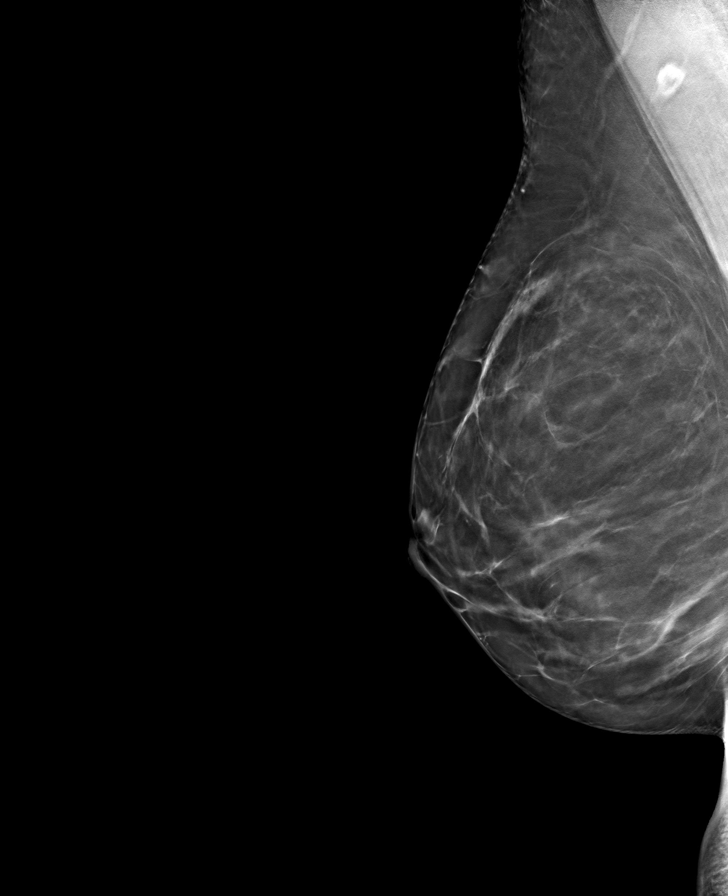

[L CC tomo · tomo slice 43/86.0]
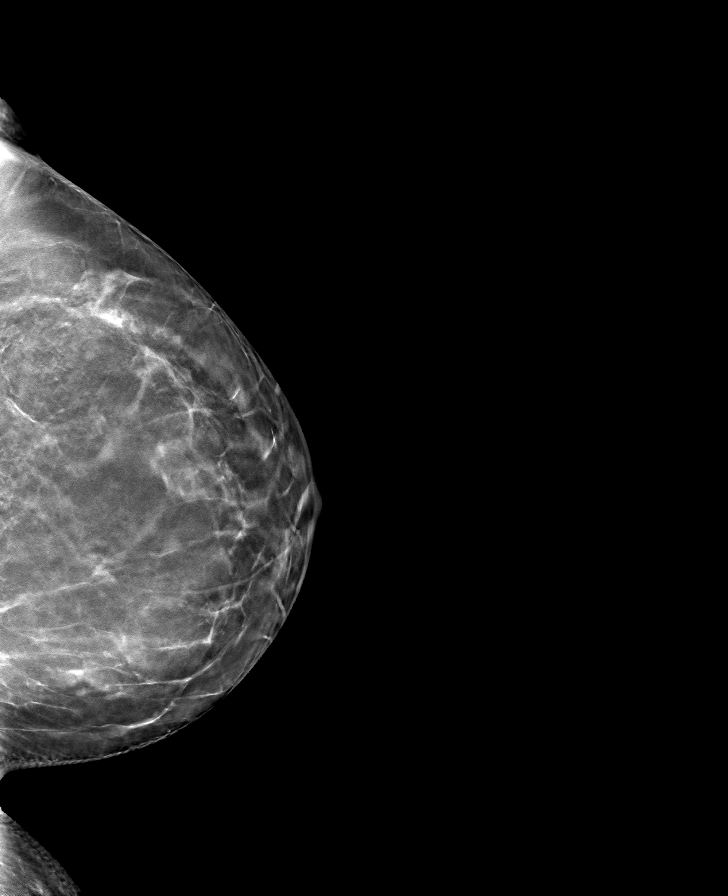

[R CC tomo · tomo slice 44/87.0]
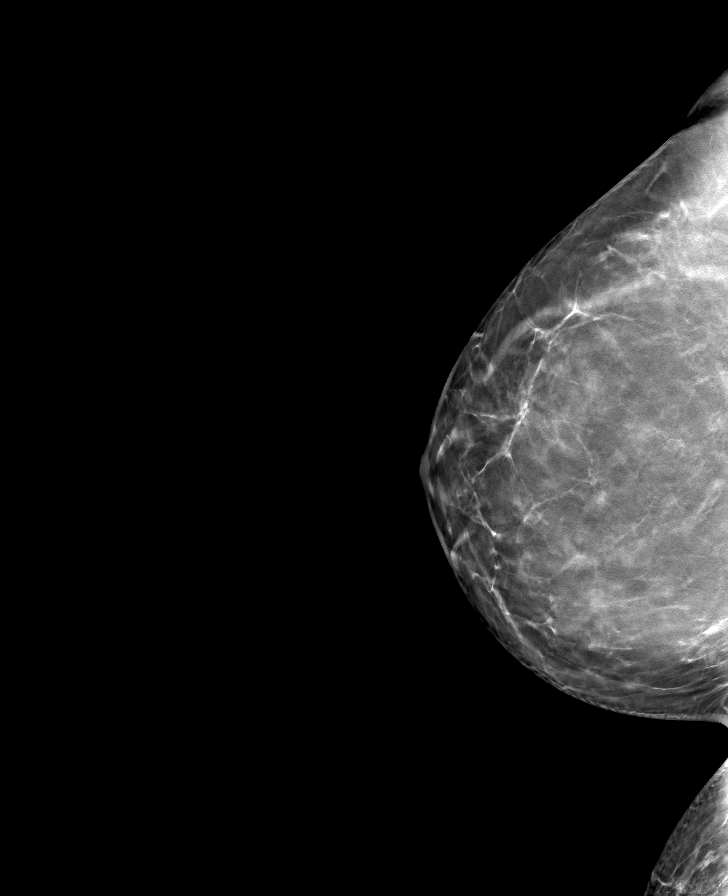

[8 of 24 positions shown; findings below may reference images not displayed]

ACR Breast Density Category b: There are scattered areas of
fibroglandular density.
FINDINGS: There are no findings suspicious for malignancy.
IMPRESSION: No mammographic evidence of malignancy. A result letter of this
screening mammogram will be mailed directly to the patient.

RECOMMENDATION:
Screening mammogram in one year. (Code:51-O-LD2)

BI-RADS CATEGORY  1: Negative.

## 2024-09-06 ENCOUNTER — Other Ambulatory Visit: Payer: Self-pay

## 2024-09-06 ENCOUNTER — Emergency Department
Admission: EM | Admit: 2024-09-06 | Discharge: 2024-09-06 | Disposition: A | Discharge status: Home / Self Care | Attending: Emergency Medicine | Admitting: Emergency Medicine

## 2024-09-06 DIAGNOSIS — S81851A Open bite, right lower leg, initial encounter: Secondary | ICD-10-CM | POA: Diagnosis present

## 2024-09-06 DIAGNOSIS — W540XXA Bitten by dog, initial encounter: Secondary | ICD-10-CM | POA: Insufficient documentation

## 2024-09-06 MED ORDER — AMOXICILLIN-POT CLAVULANATE 875-125 MG PO TABS: 1.0000 | ORAL_TABLET | Freq: Two times a day (BID) | ORAL | 0 refills | Status: AC

## 2024-09-06 MED ORDER — AMOXICILLIN-POT CLAVULANATE 875-125 MG PO TABS
1.0000 | ORAL_TABLET | Freq: Once | ORAL | Status: AC
  Administered 2024-09-06: 1 via ORAL
  Filled 2024-09-06: qty 1

## 2024-09-06 NOTE — ED Provider Notes (Signed)
" ° °  [facility] Provider Note    Event Date/Time   First MD Initiated Contact with Patient 09/06/24 2118     (approximate)   History   Animal Bite (/)   HPI  Denise Cooper is a 54 y.o. female with no similar past medical history presents after dog bite to the right calf.  Her dogs got into a fight and she tried to break it up and she was bitten on the right calf.  They have all other vaccines     Physical Exam   Triage Vital Signs: ED Triage Vitals  Encounter Vitals Group     BP 09/06/24 1913 (!) 148/80     Girls Systolic BP Percentile --      Girls Diastolic BP Percentile --      Boys Systolic BP Percentile --      Boys Diastolic BP Percentile --      Pulse Rate 09/06/24 1913 77     Resp 09/06/24 1913 18     Temp 09/06/24 1913 98.6 F (37 C)     Temp Source 09/06/24 1913 Oral     SpO2 09/06/24 1913 99 %     Weight 09/06/24 1914 65.8 kg (145 lb)     Height 09/06/24 1914 1.676 m (5' 6)     Head Circumference --      Peak Flow --      Pain Score 09/06/24 1919 6     Pain Loc --      Pain Education --      Exclude from Growth Chart --     Most recent vital signs: Vitals:   09/06/24 1913  BP: (!) 148/80  Pulse: 77  Resp: 18  Temp: 98.6 F (37 C)  SpO2: 99%     General: Awake, no distress.  CV:  Good peripheral perfusion.  Resp:  Normal effort.  Abd:  No distention.  Other:  3 small puncture wounds to the medial aspect of the right   ED Results / Procedures / Treatments   Labs (all labs ordered are listed, but only abnormal results are displayed) Labs Reviewed - No data to display   EKG     RADIOLOGY     PROCEDURES:  Critical Care performed:   Procedures   MEDICATIONS ORDERED IN ED: Medications  amoxicillin -clavulanate (AUGMENTIN ) 875-125 MG per tablet 1 tablet (has no administration in time range)     IMPRESSION / MDM / ASSESSMENT AND PLAN / ED COURSE  I reviewed the triage vital signs and the nursing  notes. Patient's presentation is most consistent with acute, uncomplicated illness.  Augmentin  prophylaxis  Wound washed here and dressed, recommend wound recheck in 2 to 3 days with PCP           FINAL CLINICAL IMPRESSION(S) / ED DIAGNOSES   Final diagnoses:  Dog bite, initial encounter     Rx / DC Orders   ED Discharge Orders          Ordered    amoxicillin -clavulanate (AUGMENTIN ) 875-125 MG tablet  2 times daily        09/06/24 2125             Note:  This document was prepared using Dragon voice recognition software and may include unintentional dictation errors.   Arlander [place], MD 09/06/24 2125  "
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
